# Patient Record
Sex: Male | Born: 1960 | Race: White | Hispanic: No | Marital: Married | Smoking: Former smoker
Health system: Southern US, Community
[De-identification: ages and names within clinical notes are randomized; demographics above are authoritative.]

## PROBLEM LIST (undated history)

## (undated) DIAGNOSIS — K219 Gastro-esophageal reflux disease without esophagitis: Secondary | ICD-10-CM

## (undated) DIAGNOSIS — T7840XA Allergy, unspecified, initial encounter: Secondary | ICD-10-CM

## (undated) HISTORY — PX: MENISCUS REPAIR: SHX5179

## (undated) HISTORY — PX: COLONOSCOPY: SHX174

## (undated) HISTORY — DX: Allergy, unspecified, initial encounter: T78.40XA

## (undated) HISTORY — PX: RECTAL SURGERY: SHX760

## (undated) HISTORY — DX: Gastro-esophageal reflux disease without esophagitis: K21.9

---

## 2000-09-02 ENCOUNTER — Emergency Department (HOSPITAL_COMMUNITY): Admission: EM | Admit: 2000-09-02 | Discharge: 2000-09-02 | Payer: Self-pay | Admitting: Emergency Medicine

## 2007-03-12 ENCOUNTER — Emergency Department (HOSPITAL_COMMUNITY): Admission: EM | Admit: 2007-03-12 | Discharge: 2007-03-12 | Payer: Self-pay | Admitting: Emergency Medicine

## 2007-10-06 ENCOUNTER — Ambulatory Visit: Payer: Self-pay | Admitting: Internal Medicine

## 2007-10-06 DIAGNOSIS — F17200 Nicotine dependence, unspecified, uncomplicated: Secondary | ICD-10-CM

## 2007-10-13 ENCOUNTER — Encounter: Payer: Self-pay | Admitting: Internal Medicine

## 2009-02-01 IMAGING — CR DG CHEST 2V
2 series · 2 of 2 positions shown · non-contrast
Comparison: No comparison films available

CLINICAL DATA: Chest pain. 
 CHEST - 2 VIEW:

[w chest pa]
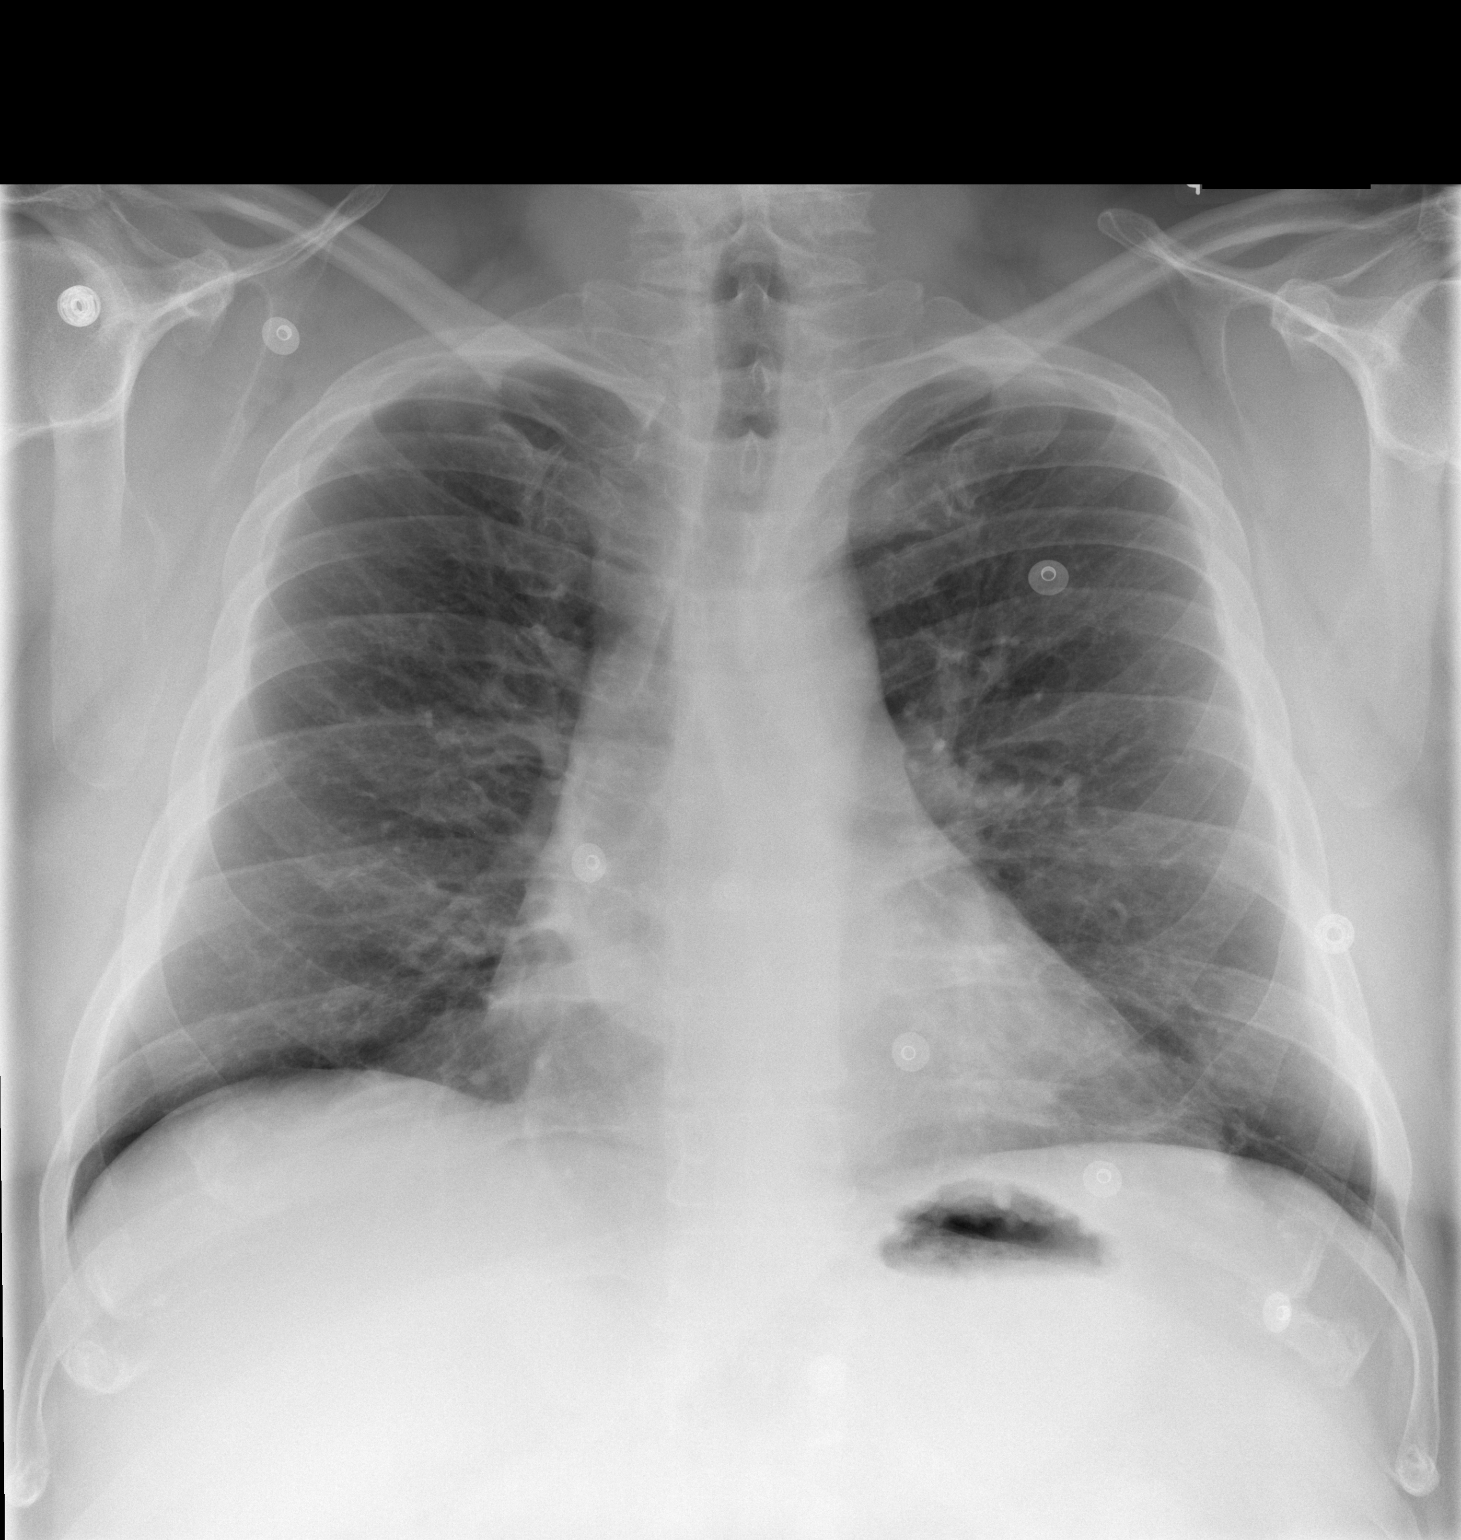

[w chest lat]
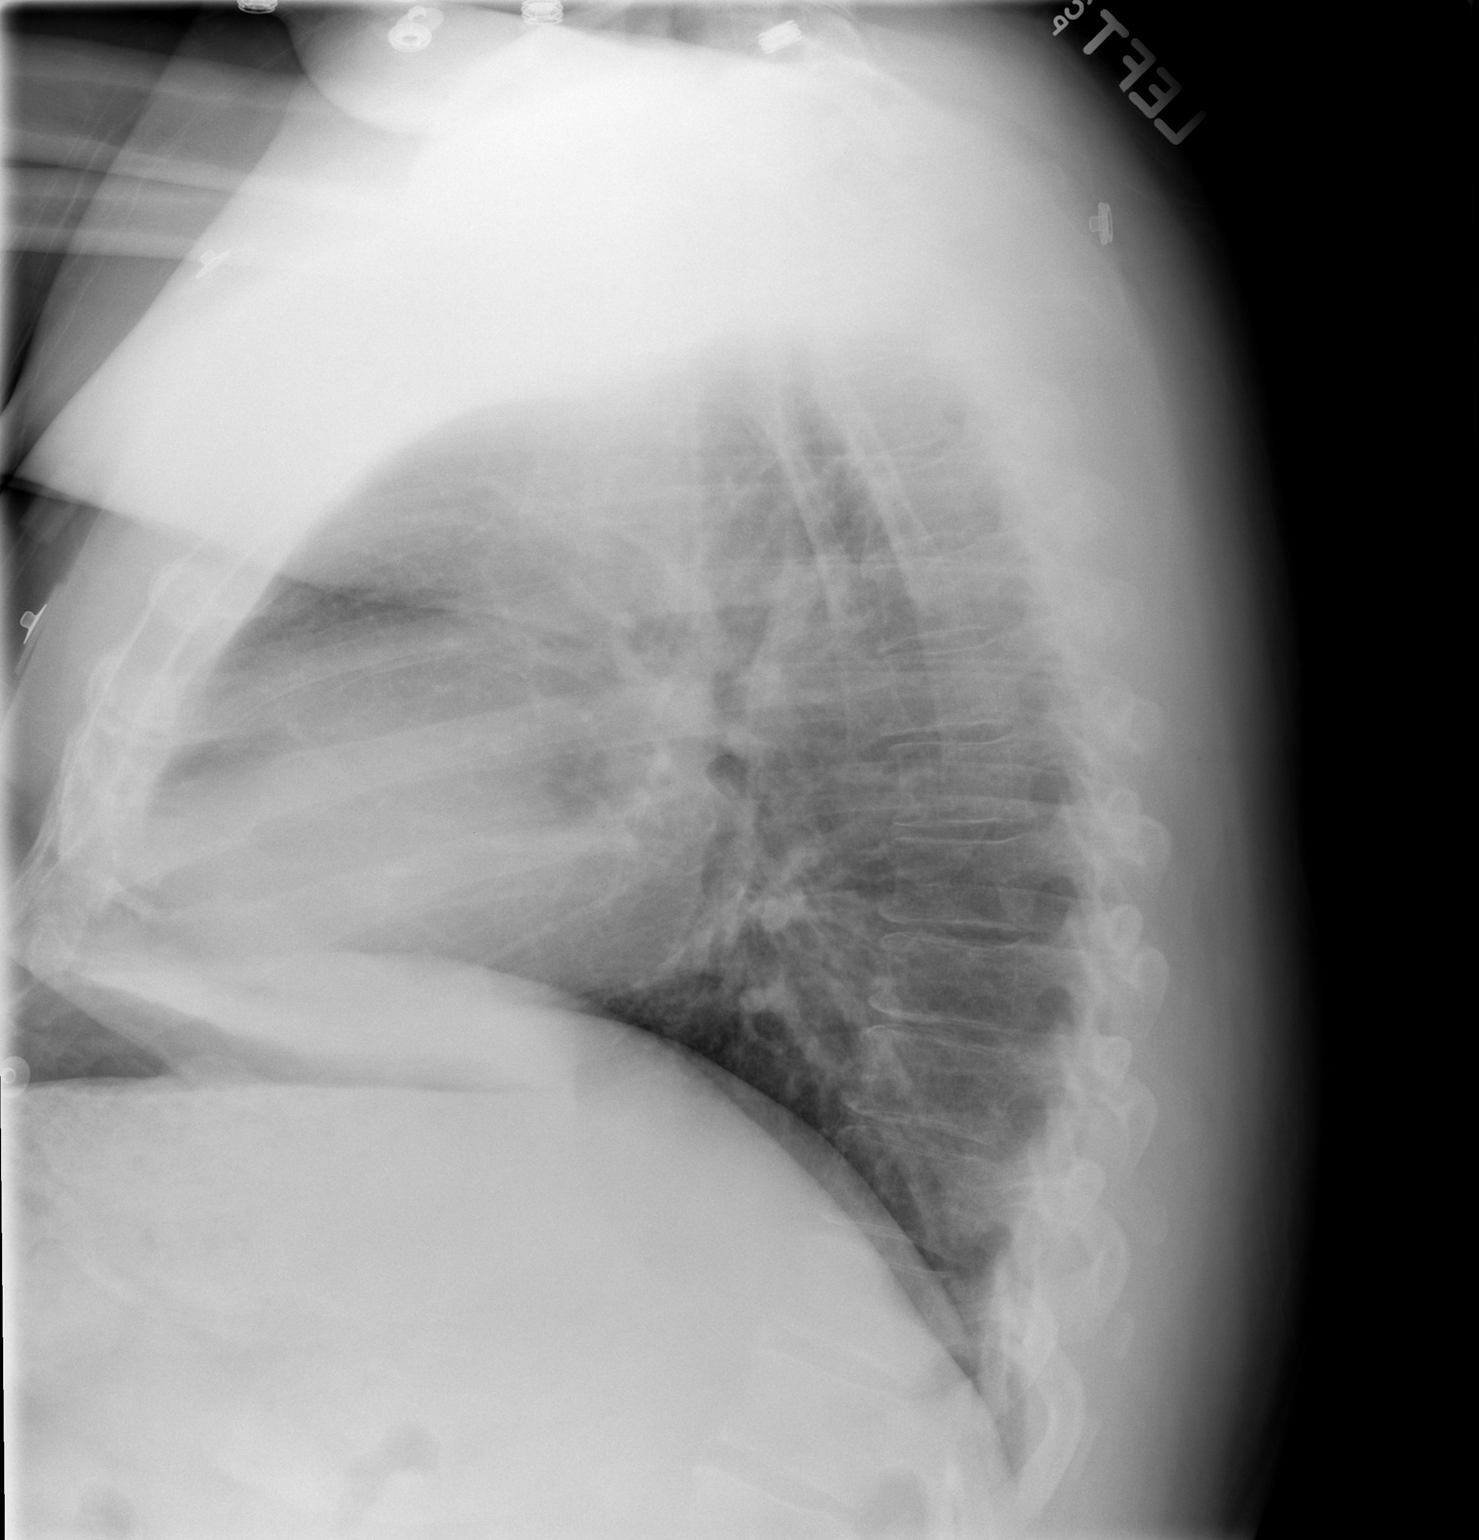

[2 of 2 positions shown; findings below may reference images not displayed]

FINDINGS: Mild cardiomegaly is noted without evidence of focal airspace disease, pleural effusions, or pneumothorax. Mild peribronchial thickening is identified. The upper abdomen is within normal limits.
IMPRESSION: Mild cardiomegaly with mild bronchitic changes.

## 2009-07-07 ENCOUNTER — Emergency Department (HOSPITAL_COMMUNITY): Admission: EM | Admit: 2009-07-07 | Discharge: 2009-07-07 | Payer: Self-pay | Admitting: Emergency Medicine

## 2009-11-20 ENCOUNTER — Telehealth (INDEPENDENT_AMBULATORY_CARE_PROVIDER_SITE_OTHER): Payer: Self-pay | Admitting: *Deleted

## 2010-10-05 LAB — CONVERTED CEMR LAB
ALT: 71 units/L — ABNORMAL HIGH (ref 0–53)
AST: 53 units/L — ABNORMAL HIGH (ref 0–37)
Albumin: 4.2 g/dL (ref 3.5–5.2)
Calcium: 9.7 mg/dL (ref 8.4–10.5)
Eosinophils Relative: 3.1 % (ref 0.0–5.0)
HCT: 46.8 % (ref 39.0–52.0)
Hemoglobin: 16.1 g/dL (ref 13.0–17.0)
Lymphocytes Relative: 40 % (ref 12.0–46.0)
MCV: 87.3 fL (ref 78.0–100.0)
Neutrophils Relative %: 44.6 % (ref 43.0–77.0)
Platelets: 263 10*3/uL (ref 150–400)
Potassium: 4.3 meq/L (ref 3.5–5.1)
RDW: 12.2 % (ref 11.5–14.6)
Sodium: 140 meq/L (ref 135–145)
TSH: 1.08 microintl units/mL (ref 0.35–5.50)
Total CHOL/HDL Ratio: 6.9
VLDL: 38 mg/dL (ref 0–40)

## 2010-10-09 NOTE — Progress Notes (Signed)
Summary: Records request from [facility]  Request for records received from [employer].Request forwarded to [employer]. Dena Chavis  November 20, 2009 8:43 AM

## 2011-01-09 ENCOUNTER — Encounter: Payer: Self-pay | Admitting: Internal Medicine

## 2011-01-09 ENCOUNTER — Ambulatory Visit (AMBULATORY_SURGERY_CENTER): Payer: BC Managed Care – PPO | Admitting: *Deleted

## 2011-01-09 VITALS — Ht 71.0 in | Wt 249.0 lb

## 2011-01-09 DIAGNOSIS — Z1211 Encounter for screening for malignant neoplasm of colon: Secondary | ICD-10-CM

## 2011-01-09 MED ORDER — PEG-KCL-NACL-NASULF-NA ASC-C 100 G PO SOLR: ORAL | Status: DC

## 2011-01-16 ENCOUNTER — Other Ambulatory Visit: Payer: Self-pay | Admitting: Internal Medicine

## 2011-01-16 ENCOUNTER — Ambulatory Visit (AMBULATORY_SURGERY_CENTER): Payer: BC Managed Care – PPO | Admitting: Internal Medicine

## 2011-01-16 ENCOUNTER — Encounter: Payer: Self-pay | Admitting: Internal Medicine

## 2011-01-16 VITALS — BP 137/82 | HR 91 | Resp 22 | Ht 70.0 in | Wt 239.0 lb

## 2011-01-16 DIAGNOSIS — Z800 Family history of malignant neoplasm of digestive organs: Secondary | ICD-10-CM

## 2011-01-16 DIAGNOSIS — K5730 Diverticulosis of large intestine without perforation or abscess without bleeding: Secondary | ICD-10-CM

## 2011-01-16 DIAGNOSIS — Z1211 Encounter for screening for malignant neoplasm of colon: Secondary | ICD-10-CM

## 2011-01-16 MED ORDER — SODIUM CHLORIDE 0.9 % IV SOLN: 500.0000 mL | INTRAVENOUS | Status: DC

## 2011-01-16 NOTE — Patient Instructions (Signed)
Please follow discharge instructions. Diverticulosis seen today,see handout. Also try to follow high fiber diet,see handout. Continue current medication. Call us with any questions or concerns. We will call you Monday morning to check on you.

## 2011-01-19 ENCOUNTER — Telehealth: Payer: Self-pay | Admitting: *Deleted

## 2011-01-19 NOTE — Telephone Encounter (Signed)
Called number and left message on voice mail that identifies patient to return call if pt having any problems, has concerns or questions-ewm, rn

## 2011-06-23 LAB — CBC
HCT: 44.6
Hemoglobin: 15.2
MCHC: 34.2
MCV: 86.7
Platelets: 264
RBC: 5.14
RDW: 12.8
WBC: 9.7

## 2011-06-23 LAB — I-STAT 8, (EC8 V) (CONVERTED LAB)
Acid-base deficit: 2
BUN: 17
Bicarbonate: 22.2
Chloride: 106
Glucose, Bld: 190 — ABNORMAL HIGH
HCT: 47
Hemoglobin: 16
Operator id: 189501
Potassium: 3.7
Sodium: 138
TCO2: 23
pCO2, Ven: 36.2 — ABNORMAL LOW
pH, Ven: 7.395 — ABNORMAL HIGH

## 2011-06-23 LAB — POCT CARDIAC MARKERS
CKMB, poc: <1 — ABNORMAL LOW
CKMB, poc: <1 — ABNORMAL LOW
Myoglobin, poc: 53.2
Operator id: 189501
Troponin i, poc: <0.05
Troponin i, poc: <0.05

## 2011-06-23 LAB — DIFFERENTIAL
Basophils Absolute: 0.1
Basophils Relative: 1
Eosinophils Absolute: 0.2
Lymphocytes Relative: 37
Lymphs Abs: 3.6 — ABNORMAL HIGH
Neutrophils Relative %: 48

## 2011-06-23 LAB — D-DIMER, QUANTITATIVE (NOT AT ARMC): D-Dimer, Quant: <0.22

## 2011-06-26 ENCOUNTER — Other Ambulatory Visit: Payer: Self-pay | Admitting: Internal Medicine

## 2011-06-26 ENCOUNTER — Other Ambulatory Visit (INDEPENDENT_AMBULATORY_CARE_PROVIDER_SITE_OTHER): Payer: BC Managed Care – PPO

## 2011-06-26 ENCOUNTER — Ambulatory Visit (INDEPENDENT_AMBULATORY_CARE_PROVIDER_SITE_OTHER): Payer: BC Managed Care – PPO | Admitting: Internal Medicine

## 2011-06-26 ENCOUNTER — Encounter: Payer: Self-pay | Admitting: Internal Medicine

## 2011-06-26 VITALS — BP 120/74 | HR 89 | Temp 97.7°F | Wt 227.0 lb

## 2011-06-26 DIAGNOSIS — E785 Hyperlipidemia, unspecified: Secondary | ICD-10-CM

## 2011-06-26 DIAGNOSIS — R739 Hyperglycemia, unspecified: Secondary | ICD-10-CM

## 2011-06-26 DIAGNOSIS — E119 Type 2 diabetes mellitus without complications: Secondary | ICD-10-CM

## 2011-06-26 DIAGNOSIS — R7309 Other abnormal glucose: Secondary | ICD-10-CM

## 2011-06-26 LAB — LIPID PANEL
Cholesterol: 202 mg/dL — ABNORMAL HIGH (ref 0–200)
Total CHOL/HDL Ratio: 6
VLDL: 25.2 mg/dL (ref 0.0–40.0)

## 2011-06-26 LAB — LDL CHOLESTEROL, DIRECT: Direct LDL: 147.2 mg/dL

## 2011-06-26 LAB — GLUCOSE, POCT (MANUAL RESULT ENTRY): POC Glucose: 108

## 2011-06-26 LAB — COMPREHENSIVE METABOLIC PANEL
Alkaline Phosphatase: 75 U/L (ref 39–117)
BUN: 15 mg/dL (ref 6–23)
Calcium: 9.4 mg/dL (ref 8.4–10.5)
Potassium: 4.7 mEq/L (ref 3.5–5.1)
Total Bilirubin: 0.7 mg/dL (ref 0.3–1.2)

## 2011-06-26 NOTE — Progress Notes (Signed)
Subjective:    Patient ID: Jordan Gallagher, male    DOB: [birth year], 50 y.o.   MRN: [id number]  HPI Jordan Gallagher has not been seen for several years. He was found to have elevated blood sugar. He saw Dr. Alessandra Bevels for life-style management and he has through diet he has lost 40 lbs and his blood sugars have been controlled. He feels great and presents for preop clearance.   Past Medical History  Diagnosis Date  . Allergy     seasonal  . Diabetes mellitus    Past Surgical History  Procedure Date  . Rectal surgery     abcess  . Repair knee ligament    Family History  Problem Relation Age of Onset  . Colon cancer Father   . Cancer Father     colon cancer  . Diabetes Neg Hx   . Heart disease Neg Hx    History   Social History  . Marital Status: Divorced    Spouse Name: N/A    Number of Children: N/A  . Years of Education: N/A   Occupational History  . businessman    Social History Main Topics  . Smoking status: Former Smoker    Types: Cigarettes    Quit date: 04/10/2010  . Smokeless tobacco: Never Used  . Alcohol Use: No  . Drug Use: No  . Sexually Active: Yes -- Male partner(s)   Other Topics Concern  . Not on file   Social History Narrative   HSG, [employer] - [place]; BS Patent attorney. Married '93 - 27yrs/divorced. 3 sons - '95, '94, '07; 1 dtr '00. 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        Review of Systems Constitutional:  Negative for fever, chills, activity change and unexpected weight change.  HEENT:  Negative for hearing loss, ear pain, congestion, neck stiffness and postnasal drip. Negative for sore throat or swallowing problems. Negative for dental complaints.   Eyes: Negative for vision loss or change in visual acuity.  Respiratory: Negative for chest tightness and wheezing. Negative for DOE.   Cardiovascular: Negative for chest pain or palpitations. No decreased exercise tolerance Gastrointestinal: No  change in bowel habit. No bloating or gas. No reflux or indigestion Genitourinary: Negative for urgency, frequency, flank pain and difficulty urinating.  Musculoskeletal: Negative for myalgias, back pain, arthralgias and gait problem.  Neurological: Negative for dizziness, tremors, weakness and headaches.  Hematological: Negative for adenopathy.  Psychiatric/Behavioral: Negative for behavioral problems and dysphoric mood.       Objective:   Physical Exam Vitals noted - normal Gen'l- WNWD white man in no distress HEENT - [place]/AT, Oropharynx w/o lesions, good dentition. C&S clear, PERRLA, Fundi- normal vasculature and disk margin Neck- supple, w/o thyromegaly Nodes - none cerivcal, supraclavicular Lungs- CTAP Cor - 2+ radial and DP pulses, RRR w/o murmur, rub, gallop ABdomen - soft Ext - no deformity Neuro - A&O x 3, CN II-XII grossly normal, DTRs 2+ throughout. Normal sensation to light touch and pin prick foot, normal deep vibratory sensation. Skin - Clear  Lab Results  Component Value Date   WBC 8.3 10/06/2007   HGB 16.1 10/06/2007   HCT 46.8 10/06/2007   PLT 263 10/06/2007   GLUCOSE 97 06/26/2011   CHOL 202* 06/26/2011   TRIG 126.0 06/26/2011   HDL 36.40* 06/26/2011   LDLDIRECT 147.2 06/26/2011   ALT 24 06/26/2011   AST 21 06/26/2011   NA 141 06/26/2011   K 4.7 06/26/2011  CL 104 06/26/2011   CREATININE 0.9 06/26/2011   BUN 15 06/26/2011   CO2 28 06/26/2011   TSH 1.08 10/06/2007   HGBA1C 6.3 06/26/2011         Assessment & Plan:  Surgical clearance - no contra indications for surgery or anesthesia.

## 2011-06-28 ENCOUNTER — Encounter: Payer: Self-pay | Admitting: Internal Medicine

## 2011-06-28 DIAGNOSIS — E785 Hyperlipidemia, unspecified: Secondary | ICD-10-CM | POA: Insufficient documentation

## 2011-06-28 DIAGNOSIS — E119 Type 2 diabetes mellitus without complications: Secondary | ICD-10-CM | POA: Insufficient documentation

## 2011-06-28 NOTE — Assessment & Plan Note (Signed)
LFL is above goal for low risk patients ( 130 or less) and definitely above goal for a diabetic patient even if diabetes is well controlled.  Plan - low fat diet with repeat labs in 6 months.

## 2011-06-28 NOTE — Assessment & Plan Note (Signed)
A1c is normal reflecting excellent control of diabetes. He was educated about long-term end-organ damaage that can occure.  Plan - continue life-style managment

## 2012-03-08 ENCOUNTER — Ambulatory Visit (INDEPENDENT_AMBULATORY_CARE_PROVIDER_SITE_OTHER): Payer: BC Managed Care – PPO | Admitting: Psychology

## 2012-03-08 DIAGNOSIS — F4323 Adjustment disorder with mixed anxiety and depressed mood: Secondary | ICD-10-CM

## 2012-03-09 ENCOUNTER — Ambulatory Visit (INDEPENDENT_AMBULATORY_CARE_PROVIDER_SITE_OTHER): Payer: BC Managed Care – PPO | Admitting: Psychology

## 2012-03-09 DIAGNOSIS — F4323 Adjustment disorder with mixed anxiety and depressed mood: Secondary | ICD-10-CM

## 2012-03-14 ENCOUNTER — Ambulatory Visit (INDEPENDENT_AMBULATORY_CARE_PROVIDER_SITE_OTHER): Payer: BC Managed Care – PPO | Admitting: Psychology

## 2012-03-14 DIAGNOSIS — F4323 Adjustment disorder with mixed anxiety and depressed mood: Secondary | ICD-10-CM

## 2012-03-17 ENCOUNTER — Ambulatory Visit: Payer: BC Managed Care – PPO | Admitting: Psychology

## 2012-03-17 ENCOUNTER — Ambulatory Visit (INDEPENDENT_AMBULATORY_CARE_PROVIDER_SITE_OTHER): Payer: BC Managed Care – PPO | Admitting: Psychology

## 2012-03-17 DIAGNOSIS — F4323 Adjustment disorder with mixed anxiety and depressed mood: Secondary | ICD-10-CM

## 2012-03-23 ENCOUNTER — Ambulatory Visit (INDEPENDENT_AMBULATORY_CARE_PROVIDER_SITE_OTHER): Payer: BC Managed Care – PPO | Admitting: Psychology

## 2012-03-23 DIAGNOSIS — F4323 Adjustment disorder with mixed anxiety and depressed mood: Secondary | ICD-10-CM

## 2012-03-28 ENCOUNTER — Ambulatory Visit (INDEPENDENT_AMBULATORY_CARE_PROVIDER_SITE_OTHER): Payer: BC Managed Care – PPO | Admitting: Psychology

## 2012-03-28 DIAGNOSIS — F4323 Adjustment disorder with mixed anxiety and depressed mood: Secondary | ICD-10-CM

## 2012-04-01 ENCOUNTER — Ambulatory Visit: Payer: BC Managed Care – PPO | Admitting: Psychology

## 2012-04-04 ENCOUNTER — Ambulatory Visit: Payer: BC Managed Care – PPO | Admitting: Psychology

## 2012-04-08 ENCOUNTER — Ambulatory Visit: Payer: BC Managed Care – PPO | Admitting: Psychology

## 2012-04-11 ENCOUNTER — Ambulatory Visit: Payer: BC Managed Care – PPO | Admitting: Psychology

## 2012-04-15 ENCOUNTER — Ambulatory Visit: Payer: BC Managed Care – PPO | Admitting: Psychology

## 2012-07-07 ENCOUNTER — Emergency Department (HOSPITAL_COMMUNITY)
Admission: EM | Admit: 2012-07-07 | Discharge: 2012-07-08 | Disposition: A | Discharge status: Home / Self Care | Payer: BC Managed Care – PPO | Attending: Emergency Medicine | Admitting: Emergency Medicine

## 2012-07-07 ENCOUNTER — Encounter (HOSPITAL_COMMUNITY): Payer: Self-pay | Admitting: *Deleted

## 2012-07-07 DIAGNOSIS — Y9389 Activity, other specified: Secondary | ICD-10-CM | POA: Insufficient documentation

## 2012-07-07 DIAGNOSIS — X500XXA Overexertion from strenuous movement or load, initial encounter: Secondary | ICD-10-CM | POA: Insufficient documentation

## 2012-07-07 DIAGNOSIS — R0789 Other chest pain: Secondary | ICD-10-CM | POA: Insufficient documentation

## 2012-07-07 DIAGNOSIS — Y92009 Unspecified place in unspecified non-institutional (private) residence as the place of occurrence of the external cause: Secondary | ICD-10-CM | POA: Insufficient documentation

## 2012-07-07 DIAGNOSIS — E119 Type 2 diabetes mellitus without complications: Secondary | ICD-10-CM | POA: Insufficient documentation

## 2012-07-07 DIAGNOSIS — IMO0002 Reserved for concepts with insufficient information to code with codable children: Secondary | ICD-10-CM | POA: Insufficient documentation

## 2012-07-07 DIAGNOSIS — M549 Dorsalgia, unspecified: Secondary | ICD-10-CM

## 2012-07-07 DIAGNOSIS — F17200 Nicotine dependence, unspecified, uncomplicated: Secondary | ICD-10-CM | POA: Insufficient documentation

## 2012-07-07 DIAGNOSIS — J309 Allergic rhinitis, unspecified: Secondary | ICD-10-CM | POA: Insufficient documentation

## 2012-07-07 LAB — CBC WITH DIFFERENTIAL/PLATELET
Basophils Absolute: 0 10*3/uL (ref 0.0–0.1)
Basophils Relative: 0 % (ref 0–1)
Eosinophils Absolute: 0.2 10*3/uL (ref 0.0–0.7)
MCH: 31 pg (ref 26.0–34.0)
MCHC: 36.1 g/dL — ABNORMAL HIGH (ref 30.0–36.0)
Neutro Abs: 4.4 10*3/uL (ref 1.7–7.7)
Neutrophils Relative %: 50 % (ref 43–77)
RDW: 13 % (ref 11.5–15.5)

## 2012-07-07 LAB — POCT I-STAT TROPONIN I: Troponin i, poc: 0 ng/mL (ref 0.00–0.08)

## 2012-07-07 LAB — COMPREHENSIVE METABOLIC PANEL
AST: 16 U/L (ref 0–37)
Albumin: 4.1 g/dL (ref 3.5–5.2)
Alkaline Phosphatase: 97 U/L (ref 39–117)
Chloride: 101 mEq/L (ref 96–112)
Creatinine, Ser: 0.73 mg/dL (ref 0.50–1.35)
Potassium: 3.5 mEq/L (ref 3.5–5.1)
Total Bilirubin: 0.3 mg/dL (ref 0.3–1.2)
Total Protein: 7.3 g/dL (ref 6.0–8.3)

## 2012-07-07 MED ORDER — ACETAMINOPHEN 325 MG PO TABS
650.0000 mg | ORAL_TABLET | Freq: Once | ORAL | Status: AC
  Administered 2012-07-07: 650 mg via ORAL
  Filled 2012-07-07: qty 2

## 2012-07-07 MED ORDER — GI COCKTAIL ~~LOC~~: 30.0000 mL | Freq: Once | ORAL | Status: DC

## 2012-07-07 NOTE — ED Provider Notes (Signed)
Pt is a 51 y/o male who is a diabetic and a smoker who developed CP this evening - acute in onset in the epigastrium, no radiation intially - assocaited diaphoresis.  He developed associated back pain when he bent over to pick something up.  At thsi time his sx have improved significantly and he has only mild sx which are worse with leaning forward and not associated with exertion, no sob, no n/v or coughing.  He has no RF for PE and has had a neg stress test per his report from 5 years ago.  ECG - non ischemic  PE:  Clear lungs, no m/r/g, strong pulses, no reproducible CP or epigastric pain, and no edema.  No acute distress, normal affect and mentation and memory.    A/p - initial labs negative, will get second troponin with second ECG at 1:00 AM, pt has an atypical pain that has near totally resolved and does not appear to be c/w ACS.  He has no hx of hypertension or hypercholesterolemia and bhis pain is non exertional.  He has mild htn at this time - will recheck.  Filed Vitals:   07/07/12 2138  BP: 162/68  Pulse: 88  Temp: 97.8 F (36.6 C)  Resp: 20    Medical screening examination/treatment/procedure(s) were conducted as a shared visit with non-physician practitioner(s) and myself.  I personally evaluated the patient during the encounter     Vida Roller, MD 07/09/12 [phone]

## 2012-07-07 NOTE — ED Provider Notes (Signed)
History     CSN: [id number]  Arrival date & time 07/07/12  2132   First MD Initiated Contact with Patient 07/07/12 2206      No chief complaint on file.   (Consider location/radiation/quality/duration/timing/severity/associated sxs/prior treatment) Patient is a 51 y.o. male presenting with chest pain. The history is provided by the patient. No language interpreter was used.  Chest Pain The chest pain began 1 - 2 hours ago. Chest pain occurs intermittently. The pain is associated with breathing and lifting. At its most intense, the pain is at 6/10. The pain is currently at 0/10. The quality of the pain is described as dull. The pain does not radiate. Chest pain is worsened by certain positions. Pertinent negatives for primary symptoms include no fever, no syncope, no cough, no wheezing, no nausea, no vomiting and no dizziness. He tried nothing for the symptoms. Risk factors include male gender and smoking/tobacco exposure.  Pertinent negatives for past medical history include no anxiety/panic attacks, no CAD, no cancer, no congenital heart disease, no COPD, no CHF, no DVT, no hyperlipidemia, no MI, no PE, no seizures, no sleep apnea, no strokes and no thyroid problem.  Pertinent negatives for family medical history include: no CAD in family, no diabetes in family, no heart disease in family, no hyperlipidemia in family, no hypertension in family, no PE in family, no PVD in family, no stroke in family and no sudden death in family.  Procedure history is positive for echocardiogram and exercise treadmill test.  Procedure history is negative for cardiac catheterization.    51 yo male with c/o midsternal chest pain 5/10 with movement especially exhaling and lifting his 52 year old.  States that he went trick or treating with his 52 year old tonight and noticed walking that he was having the pain when he exhaled and put his arms in front of his chest.  States that he did not get SOB and the pain  lasted seconds and went away.  When patient got home he bent over to get a piece of meat off his childs plate and twisted and had an episode of Lower back pain in the middle of his buttocks similar to episodes in the past. Ambulating without difficulty and no radiation of pain, numbness, incontinence, or fever.  Patient was diaphorietic with this episode.   He then noticed his midsternal pain coming back especially with exhalation.  He called his nephew who is a Engineer, civil (consulting) and took 325 of asa right away. Patient does not have high cholesterol, or hypertension.  He is a smoker and exercises. Chest  Pain is 3/10 with movement presently. Denies calf pain or long trips,  Patient goes to Dr. Arthur Holms.  Hs seen cardiology in [place] in the past with a stress test that was normal 3-5 years ago.    Past Medical History  Diagnosis Date  . Allergy     seasonal  . Diabetes mellitus     Past Surgical History  Procedure Date  . Rectal surgery     abcess  . Repair knee ligament     Family History  Problem Relation Age of Onset  . Colon cancer Father   . Cancer Father     colon cancer  . Diabetes Neg Hx   . Heart disease Neg Hx     History  Substance Use Topics  . Smoking status: Current Every Day Smoker -- 1.0 packs/day    Types: Cigarettes    Last Attempt to Quit: 04/10/2010  .  Smokeless tobacco: Never Used  . Alcohol Use: No      Review of Systems  Constitutional: Negative.  Negative for fever.  HENT: Negative.   Eyes: Negative.   Respiratory: Negative.  Negative for cough and wheezing.   Cardiovascular: Positive for chest pain. Negative for syncope.  Gastrointestinal: Negative.  Negative for nausea and vomiting.  Neurological: Negative.  Negative for dizziness and seizures.  Psychiatric/Behavioral: Negative.   All other systems reviewed and are negative.    Allergies  Codeine  Home Medications  No current outpatient prescriptions on file.  BP 162/68  Pulse 88  Temp 97.8 F (36.6  C)  Resp 20  SpO2 96%  Physical Exam  ED Course  Procedures (including critical care time) Patient wanting to leave.  Assured him he should stay for lab results.  He agreed.    Labs Reviewed  CBC WITH DIFFERENTIAL - Abnormal; Notable for the following:    MCHC 36.1 (*)     All other components within normal limits  COMPREHENSIVE METABOLIC PANEL - Abnormal; Notable for the following:    Glucose, Bld 133 (*)     All other components within normal limits  POCT I-STAT TROPONIN I  URINALYSIS, ROUTINE W REFLEX MICROSCOPIC   No results found.   No diagnosis found.    MDM   51 yo male smoker with Midsternal chest epigastric pain while trick or treating. positional with Lower back pain after leaning over to pick something up. No SOB or acute onset.   Troponin - x 2.  Ekg with no acute concerns.  Labs unremarkable.  Atypical chest pain with risk factors.  Recommend cardiac follow up with tomorrow.  Will call in am.   Pain free at discharge.        Date: 07/07/2012  Rate: 89  Rhythm: normal sinus rhythm  QRS Axis: normal  Intervals: normal  ST/T Wave abnormalities: nonspecific ST changes  Conduction Disutrbances:none  Narrative Interpretation: ST slightly elevated in II III and avf with poor baseline  Will repeat with repeat trop.  Old EKG Reviewed: changes noted  Labs Reviewed  CBC WITH DIFFERENTIAL - Abnormal; Notable for the following:    MCHC 36.1 (*)     All other components within normal limits  COMPREHENSIVE METABOLIC PANEL - Abnormal; Notable for the following:    Glucose, Bld 133 (*)     All other components within normal limits  POCT I-STAT TROPONIN I  URINALYSIS, ROUTINE W REFLEX MICROSCOPIC           Remi Haggard, NP 07/08/12 1925

## 2012-07-07 NOTE — ED Notes (Signed)
Pt c/o epigastric area chest pain radiating to back x 90 mins; pain worse with exhalation and leaning forward; diaphoresis when pain severe; nausea

## 2012-07-08 LAB — POCT I-STAT TROPONIN I

## 2012-07-08 MED ORDER — OXYCODONE-ACETAMINOPHEN 5-325 MG PO TABS: 2.0000 | ORAL_TABLET | ORAL | Status: DC | PRN

## 2012-07-09 NOTE — ED Provider Notes (Signed)
Medical screening examination/treatment/procedure(s) were conducted as a shared visit with non-physician practitioner(s) and myself.  I personally evaluated the patient during the encounter  Please see my separate respective documentation pertaining to this patient encounter   Vida Roller, MD 07/09/12 [phone]

## 2012-10-11 ENCOUNTER — Other Ambulatory Visit: Payer: Self-pay | Admitting: Orthopedic Surgery

## 2012-10-11 DIAGNOSIS — M25511 Pain in right shoulder: Secondary | ICD-10-CM

## 2012-10-14 ENCOUNTER — Other Ambulatory Visit: Payer: BC Managed Care – PPO

## 2012-10-18 ENCOUNTER — Other Ambulatory Visit: Payer: BC Managed Care – PPO

## 2012-10-23 ENCOUNTER — Other Ambulatory Visit: Payer: BC Managed Care – PPO

## 2012-10-26 ENCOUNTER — Ambulatory Visit
Admission: RE | Admit: 2012-10-26 | Discharge: 2012-10-26 | Disposition: A | Discharge status: Home / Self Care | Payer: BC Managed Care – PPO | Source: Ambulatory Visit | Attending: Orthopedic Surgery | Admitting: Orthopedic Surgery

## 2012-10-26 DIAGNOSIS — M25511 Pain in right shoulder: Secondary | ICD-10-CM

## 2013-10-03 ENCOUNTER — Telehealth: Payer: Self-pay | Admitting: Internal Medicine

## 2013-10-03 NOTE — Telephone Encounter (Signed)
Pt aware. Pt wanted to know how much a colon out of pocket might run him. Informed pt that they can run anywhere from 1400-3200 dollars. Pt states he will think about things and call us back if he decides to have a colon done sooner.

## 2013-10-03 NOTE — Telephone Encounter (Signed)
Pt had colon done in 2012, no polyps. Pts father passed away at the age of 13 with colon cancer. Pts recall is for 2017. Pt is afraid and would really like to have a colon done every 3 years. Dr. Henrene Pastor please advise.

## 2013-10-03 NOTE — Telephone Encounter (Signed)
I certainly understand his concerns. However, appropriate intervals for screening on different risk patients have been established based on scientific data This would be outside of the guidelines. In general, persons without colon polyps or family members greater than 70 with colon cancer have colonoscopy every 10 years. He had no polyps and a parent with colon cancer less than 54 years old. Guidelines recommend a closer followup for patients like him with colonoscopy in 5 years from his previous exam. If he wishes to have it sooner he might not have it covered by his plan and need to pay entirely pocket. If he wants to discuss this with me in the office, he is free to make an appointment. Thanks

## 2014-09-04 ENCOUNTER — Emergency Department (HOSPITAL_COMMUNITY): Payer: BC Managed Care – PPO

## 2014-09-04 ENCOUNTER — Encounter (HOSPITAL_COMMUNITY): Payer: Self-pay | Admitting: Emergency Medicine

## 2014-09-04 ENCOUNTER — Emergency Department (HOSPITAL_COMMUNITY)
Admission: EM | Admit: 2014-09-04 | Discharge: 2014-09-04 | Disposition: A | Discharge status: Home / Self Care | Payer: BC Managed Care – PPO | Attending: Emergency Medicine | Admitting: Emergency Medicine

## 2014-09-04 DIAGNOSIS — R110 Nausea: Secondary | ICD-10-CM

## 2014-09-04 DIAGNOSIS — E119 Type 2 diabetes mellitus without complications: Secondary | ICD-10-CM | POA: Diagnosis not present

## 2014-09-04 DIAGNOSIS — Z720 Tobacco use: Secondary | ICD-10-CM | POA: Insufficient documentation

## 2014-09-04 DIAGNOSIS — R079 Chest pain, unspecified: Secondary | ICD-10-CM | POA: Insufficient documentation

## 2014-09-04 LAB — CBC WITH DIFFERENTIAL/PLATELET
BASOS PCT: 0 % (ref 0–1)
Basophils Absolute: 0 10*3/uL (ref 0.0–0.1)
EOS PCT: 2 % (ref 0–5)
Eosinophils Absolute: 0.1 10*3/uL (ref 0.0–0.7)
HCT: 46.5 % (ref 39.0–52.0)
Hemoglobin: 16 g/dL (ref 13.0–17.0)
LYMPHS ABS: 2.9 10*3/uL (ref 0.7–4.0)
Lymphocytes Relative: 41 % (ref 12–46)
MCH: 29.6 pg (ref 26.0–34.0)
MCHC: 34.4 g/dL (ref 30.0–36.0)
MCV: 86.1 fL (ref 78.0–100.0)
MONOS PCT: 10 % (ref 3–12)
Monocytes Absolute: 0.7 10*3/uL (ref 0.1–1.0)
Neutro Abs: 3.4 10*3/uL (ref 1.7–7.7)
Neutrophils Relative %: 47 % (ref 43–77)
Platelets: 216 10*3/uL (ref 150–400)
RBC: 5.4 MIL/uL (ref 4.22–5.81)
RDW: 13.1 % (ref 11.5–15.5)
WBC: 7.2 10*3/uL (ref 4.0–10.5)

## 2014-09-04 LAB — I-STAT TROPONIN, ED
Troponin i, poc: 0 ng/mL (ref 0.00–0.08)
Troponin i, poc: 0 ng/mL (ref 0.00–0.08)

## 2014-09-04 LAB — BASIC METABOLIC PANEL
ANION GAP: 10 (ref 5–15)
BUN: 13 mg/dL (ref 6–23)
CALCIUM: 9.4 mg/dL (ref 8.4–10.5)
CO2: 25 mmol/L (ref 19–32)
Chloride: 105 mEq/L (ref 96–112)
Creatinine, Ser: 0.82 mg/dL (ref 0.50–1.35)
GFR calc Af Amer: >90 mL/min (ref >90)
GLUCOSE: 127 mg/dL — AB (ref 70–99)
POTASSIUM: 4.4 mmol/L (ref 3.5–5.1)
SODIUM: 140 mmol/L (ref 135–145)

## 2014-09-04 NOTE — ED Notes (Signed)
Pt c/o mid sternal CP starting today that is now resolved with some nausea; pt sts took 324mg  ASA

## 2014-09-04 NOTE — ED Notes (Signed)
Pt refused gown.  

## 2014-09-04 NOTE — Discharge Instructions (Signed)
Your work-up today was normal. Follow-up with your primary care physician. Return to the ED for new or worsening symptoms.   Emergency Department Resource Guide 1) Find a Doctor and Pay Out of Pocket Although you won't have to find out who is covered by your insurance plan, it is a good idea to ask around and get recommendations. You will then need to call the office and see if the doctor you have chosen will accept you as a new patient and what types of options they offer for patients who are self-pay. Some doctors offer discounts or will set up payment plans for their patients who do not have insurance, but you will need to ask so you aren't surprised when you get to your appointment.  2) Contact Your Local Health Department Not all health departments have doctors that can see patients for sick visits, but many do, so it is worth a call to see if yours does. If you don't know where your local health department is, you can check in your phone book. The CDC also has a tool to help you locate your state's health department, and many state websites also have listings of all of their local health departments.  3) Find a [facility] If your illness is not likely to be very severe or complicated, you may want to try a walk in clinic. These are popping up all over the country in pharmacies, drugstores, and shopping centers. They're usually staffed by nurse practitioners or physician assistants that have been trained to treat common illnesses and complaints. They're usually fairly quick and inexpensive. However, if you have serious medical issues or chronic medical problems, these are probably not your best option.  No Primary Care Doctor: - Call Health Connect at  [phone] - they can help you locate a primary care doctor that  accepts your insurance, provides certain services, etc. - Physician Referral Service- [phone]  Chronic Pain Problems: Organization         Address  Phone    Notes  [facility]  [phone] Patients need to be referred by their primary care doctor.   Medication Assistance: Organization         Address  Phone   Notes  [facility] Medication [facility] [address] [place], [place] [address] [phone] --Must be a resident of [facility] -- Must have NO insurance coverage whatsoever (no Medicaid/ Medicare, etc.) -- The pt. MUST have a primary care doctor that directs their care regularly and follows them in the community   Med[facility]ist  [phone]   [employer]  [phone]    Agencies that provide inexpensive medical care: Organization         Address  Phone   Notes  [place]  [phone]   [facility]    [phone]   [facility] [place],  [address] [phone]   [place] [address] [place] [phone]   Planned Parenthood    [phone]   [facility]    [phone]   [place] and [address], [place] Phone:  [phone], Fax:  [phone] Hours of Operation:  9 am - 6 pm, M-F.  Also accepts Medicaid/Medicare and self-pay.  [facility] for [address], [place] Phone: [phone], Fax: [phone])  [phone]. Hours of Operation:  8:30 am - 5:30 pm, M-F.  Also accepts Medicaid and self-pay.  [facility] [place] [address], [place] Phone: [phone]   [place], [place], [place] [phone], Ext. [phone] Mondays & Thursdays: 7-9 AM.  First 15 patients are seen on a first come, first serve basis.    [place] Providers:  Organization         Address  Phone   Notes  [facility] [address], [place] [phone] Also accepts self-pay patients.  [facility]  [address], [place]ela [place]  [phone]   [place], [address], [place] [phone]   [facility] [address] [place] [phone]   Lucianne Lei [address], [place]   [phone] Only accepts [place] Access [place] patients after they have their name applied to their card.   Self-Pay (no insurance) in [facility]:  Organization         Address  Phone   Notes  Sickle Cell Patients, [facility] Internal Medicine [place] [phone]   [facility] [place] [phone]   [facility] [place]  [place], [place], [place] [phone]   [facility]/Dr. Osei-Bonsu  [address] [place] or [address], [place] [phone] Phone number for both [place] and [place] locations is the same.  Urgent Medical and [facility] [address] [place] [phone]   [facility] [address], [place] or [address] [phone]   [facility] [place] [address] [place] [phone], phone; [phone], fax Sees patients 1st and 3rd Saturday of every month.  Must not qualify for public or private insurance (i.e. Medicaid, Medicare, [place] Health Choice, Veterans' Benefits)  Household income should be no more than 200% of the poverty level The clinic cannot treat you if you are pregnant or think you are pregnant  Sexually transmitted diseases are not treated at the clinic.    Dental Care: Organization         Address  Phone  Notes  [facility] Department of [facility] [phone] Accepts children up to age 36 who are enrolled in [place] or [place]; pregnant women with a Medicaid card; and children who have  applied for Medicaid or [place] Health Choice, but were declined, whose parents can pay a reduced fee at time of service.  [facility] Department of [facility]  [address], [place] [phone] Accepts children up to age 21 who are enrolled in [place] or [place]; pregnant women with a Medicaid card; and children who have applied for Medicaid or [place] Health Choice, but were declined, whose parents can pay a reduced fee at time of service.  [place] [facility] Access PROGRAM  [place] [phone] Patients are seen by appointment only. Walk-ins are not accepted. [place] will see patients 59 years of age and older. Monday - Tuesday (8am-5pm) Most Wednesdays (8:30-5pm) $30 per visit, cash only  [facility] Adult [employer] PROGRAM  [address], [place] [phone]  Patients are seen by appointment only. Walk-ins are not accepted. [place] will see patients 50 years of age and older. One Wednesday Evening (Monthly: Volunteer Based).  $30 per visit, cash only  [place]  [phone] for adults; Children under age 11, call Graduate Pediatric Dentistry at [phone]. Children aged 18-14, please call [phone] to request a pediatric application.  Dental services are provided in all areas of dental care including fillings, crowns and bridges, complete and partial dentures, implants, gum treatment, root canals, and extractions. Preventive care is also provided. Treatment is provided to both adults and children. Patients are selected via a lottery and there is often a waiting list.   [facility] [address], [place]  [phone] [email or web address]   [facility] [address] [place], [place] [phone], Ext. [phone] Second and Fourth Thursday of each month, opens at 6:30 AM; Clinic ends at 9 AM.  Patients are seen on a first-come first-served basis, and a  limited number are seen during each clinic.   [facility]  [address] [place]ard Danker [place], [place] [phone]   Eligibility Requirements You must have lived in [place], [place], or [place] counties for at least the last three months.   You cannot be eligible for state or federal sponsored [employer], including [employer], [place], or [employer].   You generally cannot be eligible for healthcare insurance through your employer.    How to apply: Eligibility screenings are held every Tuesday and Wednesday afternoon from 1:00 pm until 4:00 pm. You do not need an appointment for the interview!  [facility] [address], [place], [place]   [place]  [place] Department  [place]  [phone]    Behavioral Health Resources in the Community: Intensive Outpatient Programs Organization         Address  Phone  Notes  Hyden Girard. [address], [place], [place] [phone]   [facility] Outpatient [address], [place], [place]   ADS: [facility] [address], [place], [place]   [place] [address]  [place], [place] or [phone]   Substance Abuse Resources Organization         Address  Phone  Notes  Alcohol and Drug Services  [phone]   [place]  [phone]   [facility] [place]   Chinita Pester  [phone]   Residential & Outpatient Substance Abuse Program  [phone]   Psychological Services Organization         Address  Phone  Notes  [facility] [place]  [place]  [phone]   [place] [address] [place] or [phone]    Mobile Crisis Teams Organization          Address  Phone  Notes  [facility], Mobile Crisis Care Unit  [phone]   [facility]  [address] [place], [place]   [place] [address] [place] [phone]    Self-Help/Support Groups Organization         Address  Phone             Notes  [place]. of [facility] - variety of support groups  [place]  Call for more information  Narcotics Anonymous (NA), [facility]aring [facility] [address], [employer] [place]  2 meetings at this location   Residential Facilities manager         Address  Phone  Notes  [facility] [place],    [place]  [phone]   [facility]  [address], [place] [phone], [place], [place]   [place], [place] [phone] Admissions: 8am-3pm M-F  Incentives Substance [place] [address]    [place], [place] [phone]   [facility] [address] [place], [place], [place]   [facility] [address]  [place], [place]   [facility]ight Programs - Intensive Outpatient [address], [place], [place]   [facility] ([place].,  [place], [place] [phone] or [phone]   [facility] (RTS) [address], [place], [place] Accepts Medicaid  [facility] [place] [address],  [place] [phone] Substance Abuse/Addiction Treatment   [facility] Organization         Address  Phone  Notes  [facility]  [phone]   Domenic Schwab, PhD [address] Arlis Porta [place], [place]   [phone] or [phone]   [place], [place] [phone]   [facility] [address], [place], [place] [phone] Insurance/Medicaid/sponsorship  through [facility] and Families [address], [address]                                    [place], [place] [phone] [place] [address], [place] [phone]    Dr. Adele Schilder  [phone]   [facility] Dept. 1) [address],  2) [place] 3)  [address], [place] [phone]  [phone]   [place] [phone] or [phone] (After Hours)

## 2014-09-04 NOTE — ED Provider Notes (Signed)
CSN: [id number]     Arrival date & time 09/04/14  1228 History   First MD Initiated Contact with Patient 09/04/14 1524     Chief Complaint  Patient presents with  . Chest Pain     (Consider location/radiation/quality/duration/timing/severity/associated sxs/prior Treatment) The history is provided by the patient and medical records.    This is a 53 year old male with past medical history significant for diabetes, seasonal allergies, presenting to the ED for an episode of midsternal chest pain today that occurred while he was driving to work. Patient states pain was localized to midsternal region without radiation, described as sharp and stabbing in nature. He notes some associated nausea but denies vomiting, shortness of breath, diaphoresis, numbness, or paresthesias of extremities. Patient states he continued driving to work and took 2 aspirin. States he was still encouraged to come to the ER, chest pain resolved en route to the ED.  patient has no known cardiac history. His brother had an MI at age 5. Patient is an intermittent smoker, has decreased from 2 packs per day to 4 cigarettes a day. Since arrival in the ED, patient denies any recurrence of symptoms.  VS stable on arrival.  Past Medical History  Diagnosis Date  . Allergy     seasonal  . Diabetes mellitus    Past Surgical History  Procedure Laterality Date  . Rectal surgery      abcess  . Repair knee ligament     Family History  Problem Relation Age of Onset  . Colon cancer Father   . Cancer Father     colon cancer  . Diabetes Neg Hx   . Heart disease Neg Hx    History  Substance Use Topics  . Smoking status: Current Every Day Smoker -- 1.00 packs/day    Types: Cigarettes    Last Attempt to Quit: 04/10/2010  . Smokeless tobacco: Never Used  . Alcohol Use: No    Review of Systems  Cardiovascular: Positive for chest pain.  All other systems reviewed and are negative.     Allergies  Codeine  Home  Medications   Prior to Admission medications   Medication Sig Start Date End Date Taking? Authorizing Provider  oxyCODONE-acetaminophen (PERCOCET/ROXICET) 5-325 MG per tablet Take 2 tablets by mouth every 4 (four) hours as needed for pain. 07/08/12   Julieta Bellini, NP   BP 147/72 mmHg  Pulse 77  Temp(Src) 98.3 F (36.8 C) (Oral)  Resp 18  SpO2 99%   Physical Exam  Constitutional: He is oriented to person, place, and time. He appears well-developed and well-nourished. No distress.  HENT:  Head: Normocephalic and atraumatic.  Mouth/Throat: Oropharynx is clear and moist.  Eyes: Conjunctivae and EOM are normal. Pupils are equal, round, and reactive to light.  Neck: Normal range of motion. Neck supple.  Cardiovascular: Normal rate, regular rhythm and normal heart sounds.   Pulmonary/Chest: Effort normal and breath sounds normal. No respiratory distress. He has no wheezes.  Abdominal: Soft. Bowel sounds are normal. There is no tenderness. There is no guarding.  Musculoskeletal: Normal range of motion. He exhibits no edema.  No calf asymmetry, tenderness, or palpable cords; no overlying erythema or warmth to to touch; DP pulses intact bilaterally No pitting edema  Neurological: He is alert and oriented to person, place, and time.  Skin: Skin is warm and dry. He is not diaphoretic.  Psychiatric: He has a normal mood and affect.  Nursing note and vitals reviewed.   ED Course  Procedures (including critical care time) Labs Review Labs Reviewed  BASIC METABOLIC PANEL - Abnormal; Notable for the following:    Glucose, Bld 127 (*)    All other components within normal limits  CBC WITH DIFFERENTIAL  I-STAT TROPOININ, ED  Randolm Idol, ED    Imaging Review Dg Chest 2 View  09/04/2014   CLINICAL DATA:  Chest and epigastric pain  EXAM: CHEST  2 VIEW  COMPARISON:  October 06, 2007  FINDINGS: There is no edema or consolidation. The heart size and pulmonary vascularity are normal. No  adenopathy. No bone lesions. No pneumothorax.  IMPRESSION: No edema or consolidation.   Electronically Signed   By: Lowella Grip M.D.   On: 09/04/2014 13:30     EKG Interpretation None      MDM   Final diagnoses:  Nausea  Chest pain, unspecified chest pain type   53 year old male with episodic chest pain earlier this morning that resolved prior to arrival in the ED. Patient without known cardiac history, brother had MI at age 39. On exam, patient no acute distress. He denies recurrence of symptoms since arrival in the ED. Lab work including troponin unremarkable. Chest x-ray is clear. EKG sinus rhythm without ischemic changes.  Patient does have some cardiac RF (DM, family hx, smoker) so will obtain delta trop.  Delta troponin also negative. Patient remains without any recurrence of chest pain. Low suspicion for ACS, PE, dissection, or other acute cardiac event at this time.  Feel he is stable for discharge. He was encouraged to follow-up with his primary care physician.  Discussed plan with patient, he/she acknowledged understanding and agreed with plan of care.  Return precautions given for new or worsening symptoms.  Larene Pickett, PA-C 09/04/14 1739  Julianne Rice, MD 09/04/14 [phone]

## 2016-01-21 ENCOUNTER — Encounter: Payer: Self-pay | Admitting: Internal Medicine

## 2016-03-31 ENCOUNTER — Encounter: Payer: Self-pay | Admitting: Internal Medicine

## 2016-04-30 ENCOUNTER — Encounter: Payer: Self-pay | Admitting: Internal Medicine

## 2016-05-06 ENCOUNTER — Encounter: Payer: Self-pay | Admitting: Internal Medicine

## 2016-06-18 ENCOUNTER — Encounter: Payer: Self-pay | Admitting: Internal Medicine

## 2016-06-29 ENCOUNTER — Encounter: Payer: Self-pay | Admitting: Internal Medicine

## 2016-07-16 ENCOUNTER — Encounter (INDEPENDENT_AMBULATORY_CARE_PROVIDER_SITE_OTHER): Payer: Self-pay

## 2016-07-16 ENCOUNTER — Ambulatory Visit (AMBULATORY_SURGERY_CENTER): Payer: Self-pay | Admitting: *Deleted

## 2016-07-16 VITALS — Ht 71.0 in | Wt 229.0 lb

## 2016-07-16 DIAGNOSIS — Z800 Family history of malignant neoplasm of digestive organs: Secondary | ICD-10-CM

## 2016-07-16 MED ORDER — NA SULFATE-K SULFATE-MG SULF 17.5-3.13-1.6 GM/177ML PO SOLN: 1.0000 | Freq: Once | ORAL | 0 refills | Status: AC

## 2016-07-16 NOTE — Progress Notes (Signed)
No egg or soy allergy known to patient  No issues with past sedation with any surgeries  or procedures, no intubation problems  No diet pills per patient No home 02 use per patient  No blood thinners per patient  Pt denies issues with constipation  No A fib or A flutter   emmi declined'   

## 2016-08-03 ENCOUNTER — Encounter: Payer: Self-pay | Admitting: Internal Medicine

## 2016-08-13 ENCOUNTER — Ambulatory Visit (AMBULATORY_SURGERY_CENTER): Payer: BLUE CROSS/BLUE SHIELD | Admitting: Internal Medicine

## 2016-08-13 ENCOUNTER — Encounter: Payer: Self-pay | Admitting: Internal Medicine

## 2016-08-13 VITALS — BP 126/69 | HR 69 | Temp 98.0°F | Resp 12 | Ht 71.0 in | Wt 229.0 lb

## 2016-08-13 DIAGNOSIS — Z1211 Encounter for screening for malignant neoplasm of colon: Secondary | ICD-10-CM

## 2016-08-13 DIAGNOSIS — Z1212 Encounter for screening for malignant neoplasm of rectum: Secondary | ICD-10-CM

## 2016-08-13 DIAGNOSIS — Z800 Family history of malignant neoplasm of digestive organs: Secondary | ICD-10-CM | POA: Diagnosis not present

## 2016-08-13 DIAGNOSIS — D125 Benign neoplasm of sigmoid colon: Secondary | ICD-10-CM | POA: Diagnosis not present

## 2016-08-13 DIAGNOSIS — K635 Polyp of colon: Secondary | ICD-10-CM

## 2016-08-13 MED ORDER — SODIUM CHLORIDE 0.9 % IV SOLN: 500.0000 mL | INTRAVENOUS | Status: DC

## 2016-08-13 NOTE — Patient Instructions (Signed)
Impression/Recommendations:  [place] handout given to patient. Diverticulosis handout given to patient.  Repeat colonoscopy in 5 years for surveillance.  YOU HAD AN ENDOSCOPIC PROCEDURE TODAY AT [facility]:   Refer to the procedure report that was given to you for any specific questions about what was found during the examination.  If the procedure report does not answer your questions, please call your gastroenterologist to clarify.  If you requested that your care partner not be given the details of your procedure findings, then the procedure report has been included in a sealed envelope for you to review at your convenience later.  YOU SHOULD EXPECT: Some feelings of bloating in the abdomen. Passage of more gas than usual.  Walking can help get rid of the air that was put into your GI tract during the procedure and reduce the bloating. If you had a lower endoscopy (such as a colonoscopy or flexible sigmoidoscopy) you may notice spotting of blood in your stool or on the toilet paper. If you underwent a bowel prep for your procedure, you may not have a normal bowel movement for a few days.  Please Note:  You might notice some irritation and congestion in your nose or some drainage.  This is from the oxygen used during your procedure.  There is no need for concern and it should clear up in a day or so.  SYMPTOMS TO REPORT IMMEDIATELY:   Following lower endoscopy (colonoscopy or flexible sigmoidoscopy):  Excessive amounts of blood in the stool  Significant tenderness or worsening of abdominal pains  Swelling of the abdomen that is new, acute  Fever of 100F or higher  For urgent or emergent issues, a gastroenterologist can be reached at any hour by calling [phone].   DIET:  We do recommend a small meal at first, but then you may proceed to your regular diet.  Drink plenty of fluids but you should avoid alcoholic beverages for 24 hours.  ACTIVITY:  You should plan to  take it easy for the rest of today and you should NOT DRIVE or use heavy machinery until tomorrow (because of the sedation medicines used during the test).    FOLLOW UP: Our staff will call the number listed on your records the next business day following your procedure to check on you and address any questions or concerns that you may have regarding the information given to you following your procedure. If we do not reach you, we will leave a message.  However, if you are feeling well and you are not experiencing any problems, there is no need to return our call.  We will assume that you have returned to your regular daily activities without incident.  If any biopsies were taken you will be contacted by phone or by letter within the next 1-3 weeks.  Please call us at [phone] if you have not heard about the biopsies in 3 weeks.    SIGNATURES/CONFIDENTIALITY: You and/or your care partner have signed paperwork which will be entered into your electronic medical record.  These signatures attest to the fact that that the information above on your After Visit Summary has been reviewed and is understood.  Full responsibility of the confidentiality of this discharge information lies with you and/or your care-partner.

## 2016-08-13 NOTE — Progress Notes (Signed)
Report to PACU, RN, vss, BBS= Clear.  

## 2016-08-13 NOTE — Progress Notes (Signed)
Called to room to assist during endoscopic procedure.  Patient ID and intended procedure confirmed with present staff. Received instructions for my participation in the procedure from the performing physician.  

## 2016-08-13 NOTE — Op Note (Signed)
[facility] Patient Name: Jordan Gallagher Procedure Date: 08/13/2016 1:05 PM MRN: [id number] Endoscopist: Docia Chuck. Henrene Pastor , MD Age: 55 Referring MD:  Date of Birth: 25-Oct-1960 Gender: Male Account #: [id number] Procedure:                Colonoscopy, with cold snare polypectomy x 1 Indications:              Screening in patient at increased risk: Colorectal                            cancer in father before age 43 (passed at 30).                            Index exam -2012 negative for neoplasia Medicines:                Monitored Anesthesia Care Procedure:                Pre-Anesthesia Assessment:                           - Prior to the procedure, a History and Physical                            was performed, and patient medications and                            allergies were reviewed. The patient's tolerance of                            previous anesthesia was also reviewed. The risks                            and benefits of the procedure and the sedation                            options and risks were discussed with the patient.                            All questions were answered, and informed consent                            was obtained. Prior Anticoagulants: The patient has                            taken no previous anticoagulant or antiplatelet                            agents. ASA Grade Assessment: I - A normal, healthy                            patient. After reviewing the risks and benefits,                            the patient was deemed in satisfactory condition to  undergo the procedure.                           After obtaining informed consent, the colonoscope                            was passed under direct vision. Throughout the                            procedure, the patient's blood pressure, pulse, and                            oxygen saturations were monitored continuously. The   Model CF-HQ190L [id number]) scope was introduced                            through the anus and advanced to the the cecum,                            identified by appendiceal orifice and ileocecal                            valve. The ileocecal valve, appendiceal orifice,                            and rectum were photographed. The quality of the                            bowel preparation was excellent. The colonoscopy                            was performed without difficulty. The patient                            tolerated the procedure well. The bowel preparation                            used was SUPREP. Scope In: 1:09:13 PM Scope Out: 1:23:14 PM Scope Withdrawal Time: 0 hours 12 minutes 26 seconds  Total Procedure Duration: 0 hours 14 minutes 1 second  Findings:                 A 3 mm polyp was found in the sigmoid colon. The                            polyp was removed with a cold snare. Resection and                            retrieval were complete.                           Multiple medium-mouthed diverticula were found in                            the ascending colon and left colon.  The exam was otherwise without abnormality on                            direct and retroflexion views. Complications:            No immediate complications. Estimated blood loss:                            None. Estimated Blood Loss:     Estimated blood loss: none. Impression:               - One 3 mm polyp in the sigmoid colon, removed with                            a cold snare. Resected and retrieved.                           - Diverticulosis in the ascending colon and in the                            left colon.                           - The examination was otherwise normal on direct                            and retroflexion views. Recommendation:           - Repeat colonoscopy in 5 years for surveillance.                           - Patient has a  contact number available for                            emergencies. The signs and symptoms of potential                            delayed complications were discussed with the                            patient. Return to normal activities tomorrow.                            Written discharge instructions were provided to the                            patient.                           - Resume previous diet.                           - Continue present medications.                           - Await pathology results. Docia Chuck. Henrene Pastor, MD 08/13/2016 1:34:31 PM This report has been signed  electronically.

## 2016-08-14 ENCOUNTER — Telehealth: Payer: Self-pay

## 2016-08-14 ENCOUNTER — Telehealth: Payer: Self-pay | Admitting: *Deleted

## 2016-08-14 NOTE — Telephone Encounter (Signed)
  Follow up Call-  Call back number 08/13/2016  Post procedure Call Back phone  # [phone]  Permission to leave phone message Yes  Some recent data might be hidden     No answer at # given.  Left message on VM.

## 2016-08-14 NOTE — Telephone Encounter (Signed)
  Follow up Call-  Call back number 08/13/2016  Post procedure Call Back phone  # [phone]  Permission to leave phone message Yes  Some recent data might be hidden     Patient questions:  Do you have a fever, pain , or abdominal swelling? No. Pain Score  0 *  Have you tolerated food without any problems? Yes.    Have you been able to return to your normal activities? Yes.    Do you have any questions about your discharge instructions: Diet   No. Medications  No. Follow up visit  No.  Do you have questions or concerns about your Care? No.  Actions: * If pain score is 4 or above: No action needed, pain <4.

## 2016-08-18 ENCOUNTER — Encounter: Payer: Self-pay | Admitting: Internal Medicine

## 2018-09-16 ENCOUNTER — Ambulatory Visit: Payer: BLUE CROSS/BLUE SHIELD | Admitting: Physical Therapy

## 2018-10-31 ENCOUNTER — Ambulatory Visit: Payer: BLUE CROSS/BLUE SHIELD | Attending: Neurosurgery | Admitting: Physical Therapy

## 2021-07-21 ENCOUNTER — Encounter: Payer: Self-pay | Admitting: Internal Medicine

## 2021-08-13 ENCOUNTER — Encounter: Payer: Self-pay | Admitting: Internal Medicine

## 2021-08-28 ENCOUNTER — Ambulatory Visit (AMBULATORY_SURGERY_CENTER): Payer: BLUE CROSS/BLUE SHIELD | Admitting: *Deleted

## 2021-08-28 ENCOUNTER — Other Ambulatory Visit: Payer: Self-pay

## 2021-08-28 VITALS — Ht 71.0 in | Wt 213.0 lb

## 2021-08-28 DIAGNOSIS — Z800 Family history of malignant neoplasm of digestive organs: Secondary | ICD-10-CM

## 2021-08-28 MED ORDER — NA SULFATE-K SULFATE-MG SULF 17.5-3.13-1.6 GM/177ML PO SOLN: 1.0000 | Freq: Once | ORAL | 0 refills | Status: DC

## 2021-08-28 MED ORDER — NA SULFATE-K SULFATE-MG SULF 17.5-3.13-1.6 GM/177ML PO SOLN: 1.0000 | Freq: Once | ORAL | 0 refills | Status: AC

## 2021-08-28 NOTE — Progress Notes (Signed)
No egg or soy allergy known to patient  No issues known to pt with past sedation with any surgeries or procedures Patient denies ever being told they had issues or difficulty with intubation  No FH of Malignant Hyperthermia Pt is not on diet pills Pt is not on  home 02  Pt is not on blood thinners  Pt denies issues with constipation  No A fib or A flutter  Pt is not vaccinated  for Covid    NO PA's for preps discussed with pt In PV today  Discussed with pt there will be an out-of-pocket cost for prep and that varies from $0 to 70 +  dollars - pt verbalized understanding   Due to the COVID-19 pandemic we are asking patients to follow certain guidelines in PV and the [facility]   Pt aware of COVID protocols and [facility] guidelines   PV completed over the phone. Pt verified name, DOB, address and insurance during PV today.  Pt mailed instruction packet with copy of consent form to read and not return, and instructions.    Pt encouraged to call with questions or issues.  If pt has My chart, procedure instructions sent via My Chart

## 2021-09-25 ENCOUNTER — Telehealth: Payer: Self-pay | Admitting: Internal Medicine

## 2021-09-25 DIAGNOSIS — Z800 Family history of malignant neoplasm of digestive organs: Secondary | ICD-10-CM

## 2021-09-25 MED ORDER — NA SULFATE-K SULFATE-MG SULF 17.5-3.13-1.6 GM/177ML PO SOLN: 1.0000 | Freq: Once | ORAL | 0 refills | Status: AC

## 2021-09-25 NOTE — Telephone Encounter (Signed)
Sent in prep to [facility]

## 2021-09-25 NOTE — Telephone Encounter (Signed)
Inbound call from patient requesting a call back to discuss medication and prep instructions.

## 2021-09-25 NOTE — Telephone Encounter (Signed)
New set of instructions to pt's email at [email or web address] We did discuss diet changes 1-21 and day of and day before prep instructions

## 2021-09-26 ENCOUNTER — Encounter: Payer: Self-pay | Admitting: Internal Medicine

## 2021-10-02 ENCOUNTER — Encounter: Payer: Self-pay | Admitting: Internal Medicine

## 2021-10-02 ENCOUNTER — Other Ambulatory Visit: Payer: Self-pay

## 2021-10-02 ENCOUNTER — Ambulatory Visit (AMBULATORY_SURGERY_CENTER): Payer: BC Managed Care – PPO | Admitting: Internal Medicine

## 2021-10-02 VITALS — BP 130/80 | HR 72 | Temp 97.8°F | Resp 20 | Ht 61.0 in | Wt 213.0 lb

## 2021-10-02 DIAGNOSIS — K635 Polyp of colon: Secondary | ICD-10-CM | POA: Diagnosis present

## 2021-10-02 DIAGNOSIS — Z800 Family history of malignant neoplasm of digestive organs: Secondary | ICD-10-CM | POA: Diagnosis not present

## 2021-10-02 DIAGNOSIS — D120 Benign neoplasm of cecum: Secondary | ICD-10-CM

## 2021-10-02 DIAGNOSIS — Z1211 Encounter for screening for malignant neoplasm of colon: Secondary | ICD-10-CM

## 2021-10-02 MED ORDER — SODIUM CHLORIDE 0.9 % IV SOLN: 500.0000 mL | Freq: Once | INTRAVENOUS | Status: DC

## 2021-10-02 NOTE — Progress Notes (Signed)
HISTORY OF PRESENT ILLNESS:  Jordan Gallagher is a 61 y.o. male presents today for screening colonoscopy.  Family history of colon cancer in his father at age 37.  2 previous examinations in 2012 in 2017 were negative for neoplasia.  No active complaints  REVIEW OF SYSTEMS:  All non-GI ROS negative. Past Medical History:  Diagnosis Date   Allergy    seasonal   Diabetes mellitus    past hx- diet changed   GERD (gastroesophageal reflux disease)    occ Pepcid AC    Past Surgical History:  Procedure Laterality Date   COLONOSCOPY     LUMBAR DISC SURGERY     MENISCUS REPAIR     RECTAL SURGERY     abcess    Social History JAASIEL HOLLYFIELD  reports that he quit smoking about 13 years ago. His smoking use included cigarettes. He smoked an average of 1 pack per day. He has never used smokeless tobacco. He reports that he does not drink alcohol and does not use drugs.  family history includes Cancer in his father; Colon cancer in his father.  Allergies  Allergen Reactions   Codeine Nausea Only    Cold sweats       PHYSICAL EXAMINATION:  Vital signs: BP (!) 152/85    Pulse 90    Temp 97.8 F (36.6 C) (Temporal)    Resp 14    Ht 5\' 1"  (1.549 m)    Wt 213 lb (96.6 kg)    SpO2 99%    BMI 40.25 kg/m  General: Well-developed, well-nourished, no acute distress HEENT: Sclerae are anicteric, conjunctiva pink. Oral mucosa intact Lungs: Clear Heart: Regular Abdomen: soft, nontender, nondistended, no obvious ascites, no peritoneal signs, normal bowel sounds. No organomegaly. Extremities: No edema Psychiatric: alert and oriented x3. Cooperative     ASSESSMENT:  1.  Family history of colon cancer   PLAN:  1.  Screening colonoscopy

## 2021-10-02 NOTE — Op Note (Signed)
[facility] Patient Name: Jordan Gallagher Procedure Date: 10/02/2021 1:26 PM MRN: [id number] Endoscopist: Docia Chuck. Henrene Pastor , MD Age: 61 Referring MD:  Date of Birth: 1960-10-28 Gender: Male Account #: [id number] Procedure:                Colonoscopy with cold snare polypectomy x 1 Indications:              Screening in patient at increased risk: Colorectal                            cancer in father before age 67 (age 72). Previous                            examinations 2012 and 2017 were negative for                            neoplasia. Medicines:                Monitored Anesthesia Care Procedure:                Pre-Anesthesia Assessment:                           - Prior to the procedure, a History and Physical                            was performed, and patient medications and                            allergies were reviewed. The patient's tolerance of                            previous anesthesia was also reviewed. The risks                            and benefits of the procedure and the sedation                            options and risks were discussed with the patient.                            All questions were answered, and informed consent                            was obtained. Prior Anticoagulants: The patient has                            taken no previous anticoagulant or antiplatelet                            agents. ASA Grade Assessment: II - A patient with                            mild systemic disease. After reviewing the risks  and benefits, the patient was deemed in                            satisfactory condition to undergo the procedure.                           After obtaining informed consent, the colonoscope                            was passed under direct vision. Throughout the                            procedure, the patient's blood pressure, pulse, and                            oxygen saturations were  monitored continuously. The                            Olympus Colonoscope #[id number] was introduced through                            the anus and advanced to the the cecum, identified                            by appendiceal orifice and ileocecal valve. The                            ileocecal valve, appendiceal orifice, and rectum                            were photographed. The quality of the bowel                            preparation was excellent. The colonoscopy was                            performed without difficulty. The patient tolerated                            the procedure well. The bowel preparation used was                            SUPREP via split dose instruction. Scope In: 1:42:04 PM Scope Out: 1:54:45 PM Scope Withdrawal Time: 0 hours 11 minutes 1 second  Total Procedure Duration: 0 hours 12 minutes 41 seconds  Findings:                 A 2 mm polyp was found in the cecum. The polyp was                            removed with a cold snare. Resection and retrieval                            were complete.  Multiple diverticula were found in the sigmoid                            colon and ascending colon.                           The exam was otherwise without abnormality on                            direct and retroflexion views. Complications:            No immediate complications. Estimated blood loss:                            None. Estimated Blood Loss:     Estimated blood loss: none. Impression:               - One 2 mm polyp in the cecum, removed with a cold                            snare. Resected and retrieved.                           - Diverticulosis in the sigmoid colon and in the                            ascending colon.                           - The examination was otherwise normal on direct                            and retroflexion views. Recommendation:           - Repeat colonoscopy in 5 years for  surveillance                            (family history).                           - Patient has a contact number available for                            emergencies. The signs and symptoms of potential                            delayed complications were discussed with the                            patient. Return to normal activities tomorrow.                            Written discharge instructions were provided to the                            patient.                           -  Resume previous diet.                           - Continue present medications.                           - Await pathology results. Docia Chuck. Henrene Pastor, MD 10/02/2021 2:02:14 PM This report has been signed electronically.

## 2021-10-02 NOTE — Progress Notes (Signed)
Called to room to assist during endoscopic procedure.  Patient ID and intended procedure confirmed with present staff. Received instructions for my participation in the procedure from the performing physician.  

## 2021-10-02 NOTE — Patient Instructions (Signed)
Handouts given for diverticulosis and polyps.  YOU HAD AN ENDOSCOPIC PROCEDURE TODAY AT [facility]:   Refer to the procedure report that was given to you for any specific questions about what was found during the examination.  If the procedure report does not answer your questions, please call your gastroenterologist to clarify.  If you requested that your care partner not be given the details of your procedure findings, then the procedure report has been included in a sealed envelope for you to review at your convenience later.  YOU SHOULD EXPECT: Some feelings of bloating in the abdomen. Passage of more gas than usual.  Walking can help get rid of the air that was put into your GI tract during the procedure and reduce the bloating. If you had a lower endoscopy (such as a colonoscopy or flexible sigmoidoscopy) you may notice spotting of blood in your stool or on the toilet paper. If you underwent a bowel prep for your procedure, you may not have a normal bowel movement for a few days.  Please Note:  You might notice some irritation and congestion in your nose or some drainage.  This is from the oxygen used during your procedure.  There is no need for concern and it should clear up in a day or so.  SYMPTOMS TO REPORT IMMEDIATELY:  Following lower endoscopy (colonoscopy or flexible sigmoidoscopy):  Excessive amounts of blood in the stool  Significant tenderness or worsening of abdominal pains  Swelling of the abdomen that is new, acute  Fever of 100F or higher  For urgent or emergent issues, a gastroenterologist can be reached at any hour by calling [phone]. Do not use MyChart messaging for urgent concerns.    DIET:  We do recommend a small meal at first, but then you may proceed to your regular diet.  Drink plenty of fluids but you should avoid alcoholic beverages for 24 hours.  ACTIVITY:  You should plan to take it easy for the rest of today and you should NOT DRIVE  or use heavy machinery until tomorrow (because of the sedation medicines used during the test).    FOLLOW UP: Our staff will call the number listed on your records 48-72 hours following your procedure to check on you and address any questions or concerns that you may have regarding the information given to you following your procedure. If we do not reach you, we will leave a message.  We will attempt to reach you two times.  During this call, we will ask if you have developed any symptoms of COVID 19. If you develop any symptoms (ie: fever, flu-like symptoms, shortness of breath, cough etc.) before then, please call [phone].  If you test positive for Covid 19 in the 2 weeks post procedure, please call and report this information to [place].    If any biopsies were taken you will be contacted by phone or by letter within the next 1-3 weeks.  Please call us at [phone] if you have not heard about the biopsies in 3 weeks.    SIGNATURES/CONFIDENTIALITY: You and/or your care partner have signed paperwork which will be entered into your electronic medical record.  These signatures attest to the fact that that the information above on your After Visit Summary has been reviewed and is understood.  Full responsibility of the confidentiality of this discharge information lies with you and/or your care-partner.

## 2021-10-02 NOTE — Progress Notes (Signed)
Pt's states no medical or surgical changes since previsit or office visit.  ° °VS DT °

## 2021-10-02 NOTE — Progress Notes (Signed)
To PACU, VSS. Report to [email or web address] 

## 2021-10-06 ENCOUNTER — Telehealth: Payer: Self-pay

## 2021-10-06 NOTE — Telephone Encounter (Signed)
Attempted f/u call. No answer, left VM. 

## 2021-10-09 ENCOUNTER — Encounter: Payer: Self-pay | Admitting: Internal Medicine
